# Patient Record
Sex: Female | Born: 1955 | Race: White | Hispanic: No | Smoking: Current every day smoker
Health system: Southern US, Community
[De-identification: ages and names within clinical notes are randomized; demographics above are authoritative.]

## PROBLEM LIST (undated history)

## (undated) DIAGNOSIS — E079 Disorder of thyroid, unspecified: Secondary | ICD-10-CM

---

## 2019-01-02 ENCOUNTER — Emergency Department: Payer: No Typology Code available for payment source

## 2019-01-02 ENCOUNTER — Emergency Department
Admission: EM | Admit: 2019-01-02 | Discharge: 2019-01-05 | Disposition: A | Discharge status: Home / Self Care | Payer: No Typology Code available for payment source | Attending: Emergency Medicine | Admitting: Emergency Medicine

## 2019-01-02 DIAGNOSIS — Y999 Unspecified external cause status: Secondary | ICD-10-CM | POA: Insufficient documentation

## 2019-01-02 DIAGNOSIS — Y906 Blood alcohol level of 120-199 mg/100 ml: Secondary | ICD-10-CM | POA: Insufficient documentation

## 2019-01-02 DIAGNOSIS — X001XXA Exposure to smoke in uncontrolled fire in building or structure, initial encounter: Secondary | ICD-10-CM | POA: Diagnosis not present

## 2019-01-02 DIAGNOSIS — F29 Unspecified psychosis not due to a substance or known physiological condition: Secondary | ICD-10-CM | POA: Insufficient documentation

## 2019-01-02 DIAGNOSIS — J705 Respiratory conditions due to smoke inhalation: Secondary | ICD-10-CM

## 2019-01-02 DIAGNOSIS — F10929 Alcohol use, unspecified with intoxication, unspecified: Secondary | ICD-10-CM

## 2019-01-02 DIAGNOSIS — F419 Anxiety disorder, unspecified: Secondary | ICD-10-CM

## 2019-01-02 DIAGNOSIS — R4182 Altered mental status, unspecified: Secondary | ICD-10-CM | POA: Diagnosis present

## 2019-01-02 DIAGNOSIS — Y92008 Other place in unspecified non-institutional (private) residence as the place of occurrence of the external cause: Secondary | ICD-10-CM | POA: Insufficient documentation

## 2019-01-02 DIAGNOSIS — F10229 Alcohol dependence with intoxication, unspecified: Secondary | ICD-10-CM | POA: Insufficient documentation

## 2019-01-02 DIAGNOSIS — F17200 Nicotine dependence, unspecified, uncomplicated: Secondary | ICD-10-CM | POA: Diagnosis not present

## 2019-01-02 DIAGNOSIS — Z046 Encounter for general psychiatric examination, requested by authority: Secondary | ICD-10-CM | POA: Insufficient documentation

## 2019-01-02 DIAGNOSIS — T59811A Toxic effect of smoke, accidental (unintentional), initial encounter: Secondary | ICD-10-CM

## 2019-01-02 DIAGNOSIS — Y9389 Activity, other specified: Secondary | ICD-10-CM | POA: Insufficient documentation

## 2019-01-02 DIAGNOSIS — F4329 Adjustment disorder with other symptoms: Secondary | ICD-10-CM | POA: Diagnosis present

## 2019-01-02 HISTORY — DX: Disorder of thyroid, unspecified: E07.9

## 2019-01-02 LAB — URINALYSIS, ROUTINE W REFLEX MICROSCOPIC
Bacteria, UA: NONE SEEN
Bilirubin Urine: NEGATIVE
Glucose, UA: NEGATIVE mg/dL
Ketones, ur: 5 mg/dL — AB
Leukocytes,Ua: NEGATIVE
Nitrite: NEGATIVE
Protein, ur: NEGATIVE mg/dL
Specific Gravity, Urine: 1.009 (ref 1.005–1.030)
pH: 7 (ref 5.0–8.0)

## 2019-01-02 LAB — COMPREHENSIVE METABOLIC PANEL
ALT: 15 U/L (ref 0–44)
AST: 20 U/L (ref 15–41)
Albumin: 4.3 g/dL (ref 3.5–5.0)
Alkaline Phosphatase: 65 U/L (ref 38–126)
Anion gap: 15 (ref 5–15)
BUN: 14 mg/dL (ref 8–23)
CO2: 21 mmol/L — ABNORMAL LOW (ref 22–32)
Calcium: 9 mg/dL (ref 8.9–10.3)
Chloride: 109 mmol/L (ref 98–111)
Creatinine, Ser: 0.53 mg/dL (ref 0.44–1.00)
GFR calc Af Amer: >60 mL/min (ref >60)
GFR calc non Af Amer: >60 mL/min (ref >60)
Glucose, Bld: 110 mg/dL — ABNORMAL HIGH (ref 70–99)
Potassium: 3.6 mmol/L (ref 3.5–5.1)
Sodium: 145 mmol/L (ref 135–145)
Total Bilirubin: 0.3 mg/dL (ref 0.3–1.2)
Total Protein: 7.7 g/dL (ref 6.5–8.1)

## 2019-01-02 LAB — CBC WITH DIFFERENTIAL/PLATELET
Abs Immature Granulocytes: 0.04 10*3/uL (ref 0.00–0.07)
Basophils Absolute: 0.1 10*3/uL (ref 0.0–0.1)
Basophils Relative: 1 %
Eosinophils Absolute: 0.4 10*3/uL (ref 0.0–0.5)
Eosinophils Relative: 5 %
HCT: 45.5 % (ref 36.0–46.0)
Hemoglobin: 14.6 g/dL (ref 12.0–15.0)
Immature Granulocytes: 1 %
Lymphocytes Relative: 38 %
Lymphs Abs: 3.1 10*3/uL (ref 0.7–4.0)
MCH: 31.4 pg (ref 26.0–34.0)
MCHC: 32.1 g/dL (ref 30.0–36.0)
MCV: 97.8 fL (ref 80.0–100.0)
Monocytes Absolute: 0.4 10*3/uL (ref 0.1–1.0)
Monocytes Relative: 5 %
Neutro Abs: 4 10*3/uL (ref 1.7–7.7)
Neutrophils Relative %: 50 %
Platelets: 352 10*3/uL (ref 150–400)
RBC: 4.65 MIL/uL (ref 3.87–5.11)
RDW: 13.1 % (ref 11.5–15.5)
WBC: 8.1 10*3/uL (ref 4.0–10.5)
nRBC: 0 % (ref 0.0–0.2)

## 2019-01-02 LAB — ETHANOL: Alcohol, Ethyl (B): 137 mg/dL — ABNORMAL HIGH (ref <10)

## 2019-01-02 LAB — OSMOLALITY: Osmolality: 337 mOsm/kg (ref 275–295)

## 2019-01-02 LAB — SALICYLATE LEVEL: Salicylate Lvl: <7 mg/dL (ref 2.8–30.0)

## 2019-01-02 LAB — T4, FREE: Free T4: 0.94 ng/dL (ref 0.82–1.77)

## 2019-01-02 LAB — URINE DRUG SCREEN, QUALITATIVE (ARMC ONLY)
Amphetamines, Ur Screen: NOT DETECTED
Barbiturates, Ur Screen: NOT DETECTED
Benzodiazepine, Ur Scrn: POSITIVE — AB
Cannabinoid 50 Ng, Ur ~~LOC~~: NOT DETECTED
Cocaine Metabolite,Ur ~~LOC~~: NOT DETECTED
MDMA (Ecstasy)Ur Screen: NOT DETECTED
Methadone Scn, Ur: NOT DETECTED
Opiate, Ur Screen: NOT DETECTED
Phencyclidine (PCP) Ur S: NOT DETECTED
Tricyclic, Ur Screen: NOT DETECTED

## 2019-01-02 LAB — ACETAMINOPHEN LEVEL: Acetaminophen (Tylenol), Serum: <10 ug/mL — ABNORMAL LOW (ref 10–30)

## 2019-01-02 LAB — TSH: TSH: 2.04 u[IU]/mL (ref 0.350–4.500)

## 2019-01-02 MED ORDER — MIDAZOLAM HCL 2 MG/2ML IJ SOLN
2.0000 mg | Freq: Once | INTRAMUSCULAR | Status: AC
  Administered 2019-01-02: 2 mg via INTRAMUSCULAR
  Filled 2019-01-02: qty 2

## 2019-01-02 MED ORDER — HALOPERIDOL LACTATE 5 MG/ML IJ SOLN
5.0000 mg | Freq: Once | INTRAMUSCULAR | Status: AC
  Administered 2019-01-02: 5 mg via INTRAMUSCULAR
  Filled 2019-01-02: qty 1

## 2019-01-02 MED ORDER — SODIUM CHLORIDE 0.9 % IV BOLUS
1000.0000 mL | Freq: Once | INTRAVENOUS | Status: AC
  Administered 2019-01-02: 1000 mL via INTRAVENOUS

## 2019-01-02 MED ORDER — DIPHENHYDRAMINE HCL 50 MG/ML IJ SOLN
12.5000 mg | INTRAMUSCULAR | Status: AC
  Administered 2019-01-02: 12.5 mg via INTRAVENOUS
  Filled 2019-01-02: qty 1
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
## 2019-01-02 NOTE — ED Notes (Signed)
[employer] in room to get statement from patient.

## 2019-01-02 NOTE — ED Notes (Signed)
Pt offered a shower, but refused.
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
## 2019-01-02 NOTE — ED Notes (Signed)
Pt. Currently sleeping in bed(BHU #2).
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
## 2019-01-02 NOTE — ED Notes (Signed)
Patient put on nonrebreather per MD

## 2019-01-02 NOTE — ED Notes (Signed)
IVC patient moved to BHU  
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
## 2019-01-02 NOTE — ED Notes (Signed)
MD Manson Passey and MD York Cerise at bedside
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
## 2019-01-02 NOTE — ED Notes (Signed)
Patient assigned to appropriate care area   Introduced self to pt  Patient oriented to unit/care area: Informed that, for their safety, care areas are designed for safety and visiting and phone hours explained to patient. Patient verbalizes understanding, and verbal contract for safety obtained  Environment secured  

## 2019-01-02 NOTE — ED Notes (Signed)
IVC/ Consult pending/ Pt moved to room 23

## 2019-01-03 NOTE — ED Notes (Signed)
Patient is on the phone attempting to contact her brother that lives in [place], left message and awaiting him to call back, Patient still smells like smoke, denies Di/hi or avh.

## 2019-01-03 NOTE — ED Notes (Signed)
Pt. In room watching tv.  Pt. Was pleasant with this nurse.  Pt. Has no complaints.  Pt. States she will come to this nurse for any questions or concerns.  Pt. Not SI/HI.

## 2019-01-03 NOTE — ED Notes (Signed)
IVC patient moved to BHU  

## 2019-01-03 NOTE — ED Notes (Signed)
Pt up and using bathroom.  Pt. Returned to room with steady gait.

## 2019-01-03 NOTE — ED Notes (Signed)
Patient is taking a shower, no signs of distress.  

## 2019-01-03 NOTE — ED Provider Notes (Signed)
--------------------------------------------   7:07 AM on 01/03/2019 -----------------------------------------   Blood pressure (!) 164/96, pulse 91, temperature 98.6 F (37 C), temperature source Oral, resp. rate 19, weight 63.5 kg, SpO2 100 %.  The patient is calm and cooperative at this time.  There have been no acute events since the last update.  Patient has been medically cleared.  Awaiting disposition plan from Behavioral Medicine team.    Minna Antis, MD 01/03/19 [phone]

## 2019-01-03 NOTE — ED Notes (Addendum)
Patient's brother called and she has been talking to him, Patient is calm and cooperative. Patient's brother is Demyiah Hallisey [phone])

## 2019-01-03 NOTE — ED Notes (Signed)
Patient ate 100% of breakfast and beverage. No signs of distress.

## 2019-01-03 NOTE — ED Notes (Signed)
Patient ate 100% of lunch and beverage.  

## 2019-01-04 DIAGNOSIS — F4329 Adjustment disorder with other symptoms: Secondary | ICD-10-CM

## 2019-01-04 NOTE — ED Notes (Signed)
Hourly rounding reveals patient in room. No complaints, stable, in no acute distress. Q15 minute rounds and monitoring via Security Cameras to continue. 

## 2019-01-04 NOTE — ED Notes (Signed)
Report to include Situation, Background, Assessment, and Recommendations received from [facility]. Patient alert and oriented, warm and dry, in no acute distress. Patient denies SI, HI, AVH and pain. Patient made aware of Q15 minute rounds and security cameras for their safety. Patient instructed to come to me with needs or concerns.

## 2019-01-04 NOTE — ED Notes (Signed)
Hourly rounding reveals patient sleeping in room. No complaints, stable, in no acute distress. Q15 minute rounds and monitoring via Security Cameras to continue. 

## 2019-01-04 NOTE — Discharge Instructions (Signed)
Social Security Information:    To learn more about applying for social security you can go to the website: https://www.ssa.gov/ssi/  You can also contact a social security representative at [phone]   And additionally, information for your local social security office:    Address:    [address]  [address] [place] [address]    Phone: [phone]  TTY: [phone]    Fax: [phone]    Hours:   Monday 9:00 AM - 4:00 PM  Tuesday 9:00 AM - 4:00 PM  Wednesday 9:00 AM - 4:00 PM  Thursday 9:00 AM - 4:00 PM  Friday 9:00 AM - 4:00 PM  Saturday Closed  Sunday Closed    Alcohol Intoxication  Alcohol intoxication happens when you cannot think clearly or function well (get impaired) after  drinking alcohol. This can happen after just one drink. The effect that alcohol has on how you  think and function depends on:   How much alcohol you drank.   Your age, your weight, and whether you are a man or a woman.   How often you drink alcohol.   If you have other medical problems.  Alcohol intoxication can range from mild to very bad. It can be dangerous, especially if you:   Drink a large amount of alcohol in a short time (binge drink).   For women, binge drinking is having four or more drinks at one time.   For men, binge drinking is having five or more drinks at one time.   Take certain drugs or medicines.  If you or anyone around you seems intoxicated:   Tell someone.   Get help from someone.  Follow these instructions at home:  Eating and drinking   Ask your doctor if alcohol is safe for you.   If your doctor says that alcohol is safe for you, limit how much you drink to no  more than 1 drink a day for women who are not pregnant and 2 drinks a day for  men. One drink equals one of these:   12 oz of beer.   5 oz of wine.   1 oz of hard liquor.   Do not drink alcohol if:   Your doctor tells you not to drink.   You are pregnant, may be pregnant, or are planning to get pregnant.   You are under the  legal drinking age (65 years old in the [place]   You are taking medicines that you should not take with alcohol.   Alcohol causes your medical problem to get worse.     You have to drive or do activities that need you to be alert.   You have substance use disorder. This is when using alcohol again and  again causes problems with your health, your relationships, or with what  you need to do at work, home, or school.   Be sure to eat before you drink alcohol. Avoid drinking when you have an empty  stomach.   Make sure you have enough fluid in your body (stay hydrated). To do this:   Drink enough fluid to keep your pee (urine) pale yellow.   Avoid caffeine, which may be in coffee, tea, and some sodas. Caffeine can  make you thirsty.   Try not to drink more than one drink an hour.   If you are having more than one drink, have a drink without alcohol (such as water)  between your drinks.  General instructions     Take over-the-counter and prescription medicines only as told by your doctor.   Do not drive after drinking any amount of alcohol. Plan for a designated driver or another  way to go home.   Have someone you trust stay with you while you are intoxicated. Youshould not be left  alone.   Keep all follow-up visits as told by your doctor. This is important.  Contact a doctor if:   You do not feel better after a few days.   You have problems at work, at school, or at home due to drinking.  Get help right away if:   You have any of the following:  ? Moderate or very bad trouble with:  ? Movement (coordination).  ? Talking.  ? Memory.  ? Paying attention to things.  ? Trouble staying awake.  ? Being very confused.  ? Jerky movements that you cannot control (seizure).  ? Light-headedness.  ? Fainting.  ? Throwing up (vomiting) blood. The blood may be bright red or look like coffee  grounds.  ? Blood in your poop (stool). The blood may:  ? Be bright red.  ? Make your poop black and tarry and make it smell bad.  ? Feeling  shaky when you try to stop drinking.  ? Thoughts about hurting yourself or others.    If you ever feel like you may hurt yourself or others, or have thoughts about taking your  own life, get help right away. You can go to your nearest emergency department or call:   Your local emergency services (911 in the [place]   A suicide crisis helpline, such as the National Suicide Prevention Lifeline at 1-800-  273-8255. This is open 24 hours a day.  Summary   Alcohol intoxication happens when you cannot think clearly or function well (get  impaired) after drinking alcohol. This can happen after just one drink.   If your doctor says that alcohol is safe for you, limit how much you drink to no more than  1 drink a day for women who are not pregnant and 2 drinks a day for men.   Contact a doctor if you have problems at work, at school, or at home due to drinking.   Get help right away if you have thoughts about hurting yourself or others.  This information is not intended to replace advice given to you by your health care provider.  Make sure you discuss any questions you have with your health care provider.  Document Released: 03/05/2008 Document Revised: 01/07/2018 Document Reviewed: 01/07/2018  Elsevier Interactive Patient Education  2019 [employer]

## 2019-01-04 NOTE — ED Notes (Signed)
Patient observed at this time pacing in her room back and forth looking around the room. Patient has been pacing for a considerable amount of time.
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
## 2019-01-04 NOTE — ED Notes (Signed)
Pt. Speaking with case management.

## 2019-01-04 NOTE — ED Notes (Signed)
Pt. Observed by this Event organiser in her bed for sometime after pacing, but then got out of bed, and proceeded to start pacing again. Pt. Also observed peaking out her bedroom doorway frequently in a concerned manor. Will continue to monitor for safety and provide support and comfort.

## 2019-01-04 NOTE — ED Notes (Signed)
Snack and beverage given. 
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
## 2019-01-04 NOTE — ED Notes (Signed)
Patient alert and in no visible distress. Patient denies SI, HI, AVH and pain. Patient made aware of Q15 minute rounds and security cameras for their safety. Patient instructed to come to me with needs or concerns. Pt. Given breakfast to eat.

## 2019-01-04 NOTE — Progress Notes (Signed)
Patient being seen by psychiatrist.

## 2019-01-05 ENCOUNTER — Telehealth: Payer: Self-pay | Admitting: Emergency Medicine

## 2019-01-05 NOTE — ED Notes (Signed)
Hourly rounding reveals patient sleeping in room. No complaints, stable, in no acute distress. Q15 minute rounds and monitoring via Security Cameras to continue. 

## 2019-01-05 NOTE — ED Notes (Addendum)
Hourly rounding reveals patient in room. No complaints, stable, in no acute distress. Q15 minute rounds and monitoring via Security Cameras to continue. 

## 2019-01-05 NOTE — TOC Transition Note (Signed)
Transition of Care [facility]) - CM/SW Discharge Note   Patient Details  Name: Jamie Perkins MRN: [id number] Date of Birth: April 13, 1956  [facility] CM/SW Contact:  Cala Bradford, LCSW Phone Number:  [phone] 01/05/2019, 12:50 PM   Clinical Narrative:    CSW consulted with TTS regarding pt's discharge. CSW was informed that IVC is rescinded and that pt is able to leave. Pt has been provided with resources. Pt informed TTS that she has funds to pay for a hotel. Pt was provided with bus tickets by [facility]. Pt's brother, Jomarie Longs, was notified of discharge.     Barriers to Discharge: Other (comment)(IVC)   Patient Goals and CMS Choice        Discharge Placement                       Discharge Plan and Services In-house Referral: Clinical Social Work                        Social Determinants of Health (SDOH) Interventions     Readmission Risk Interventions No flowsheet data found.
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
## 2019-01-05 NOTE — ED Provider Notes (Signed)
Cleared for Discharge by psychiatry Dr. Russella Dar, MD 01/05/19 1258

## 2019-01-05 NOTE — ED Notes (Signed)
Pt given breakfast tray. Hand hygiene encouraged.  
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
## 2019-01-18 LAB — COOXEMETRY PANEL
Carboxyhemoglobin: 2.7 % — ABNORMAL HIGH (ref 0.5–1.5)
Methemoglobin: 1.2 % (ref 0.0–1.5)
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
